# Patient Record
Sex: Female | Born: 1960 | Race: White | Hispanic: No | Marital: Married | Smoking: Former smoker
Health system: Southern US, Community
[De-identification: ages and names within clinical notes are randomized; demographics above are authoritative.]

## PROBLEM LIST (undated history)

## (undated) HISTORY — PX: TONSILLECTOMY: SUR1361

## (undated) HISTORY — PX: CHOLECYSTECTOMY: SHX55

## (undated) HISTORY — PX: BUNIONECTOMY: SHX129

## (undated) HISTORY — PX: CARPAL TUNNEL RELEASE: SHX101

## (undated) HISTORY — PX: EYE SURGERY: SHX253

---

## 2000-11-17 ENCOUNTER — Inpatient Hospital Stay (HOSPITAL_COMMUNITY): Admission: EM | Admit: 2000-11-17 | Discharge: 2000-11-28 | Payer: Self-pay | Admitting: *Deleted

## 2001-10-06 ENCOUNTER — Ambulatory Visit (HOSPITAL_COMMUNITY): Admission: RE | Admit: 2001-10-06 | Discharge: 2001-10-06 | Payer: Self-pay | Admitting: Pulmonary Disease

## 2001-10-06 ENCOUNTER — Encounter: Payer: Self-pay | Admitting: Pulmonary Disease

## 2001-12-15 ENCOUNTER — Ambulatory Visit: Admission: RE | Admit: 2001-12-15 | Discharge: 2001-12-15 | Payer: Self-pay | Admitting: Internal Medicine

## 2003-03-21 ENCOUNTER — Emergency Department (HOSPITAL_COMMUNITY): Admission: EM | Admit: 2003-03-21 | Discharge: 2003-03-21 | Payer: Self-pay | Admitting: Emergency Medicine

## 2003-04-29 ENCOUNTER — Emergency Department (HOSPITAL_COMMUNITY): Admission: EM | Admit: 2003-04-29 | Discharge: 2003-04-30 | Payer: Self-pay | Admitting: Emergency Medicine

## 2004-08-07 ENCOUNTER — Ambulatory Visit: Payer: Self-pay | Admitting: Internal Medicine

## 2004-08-07 ENCOUNTER — Inpatient Hospital Stay (HOSPITAL_COMMUNITY): Admission: EM | Admit: 2004-08-07 | Discharge: 2004-08-15 | Payer: Self-pay | Admitting: Emergency Medicine

## 2004-08-18 ENCOUNTER — Ambulatory Visit: Payer: Self-pay | Admitting: Internal Medicine

## 2005-05-03 ENCOUNTER — Inpatient Hospital Stay (HOSPITAL_COMMUNITY): Admission: EM | Admit: 2005-05-03 | Discharge: 2005-05-10 | Payer: Self-pay | Admitting: Emergency Medicine

## 2006-02-02 ENCOUNTER — Emergency Department (HOSPITAL_COMMUNITY): Admission: EM | Admit: 2006-02-02 | Discharge: 2006-02-02 | Payer: Self-pay | Admitting: Emergency Medicine

## 2006-02-03 ENCOUNTER — Inpatient Hospital Stay (HOSPITAL_COMMUNITY): Admission: EM | Admit: 2006-02-03 | Discharge: 2006-02-10 | Payer: Self-pay | Admitting: Emergency Medicine

## 2006-12-29 IMAGING — CR DG CHEST 2V
2 series · 2 of 2 positions shown · non-contrast
Comparison: Previous studies of 05/03/05.

CLINICAL DATA: Status asthmaticus. 
 CHEST ? 2 VIEW:

[view not recorded (1 of 2)]
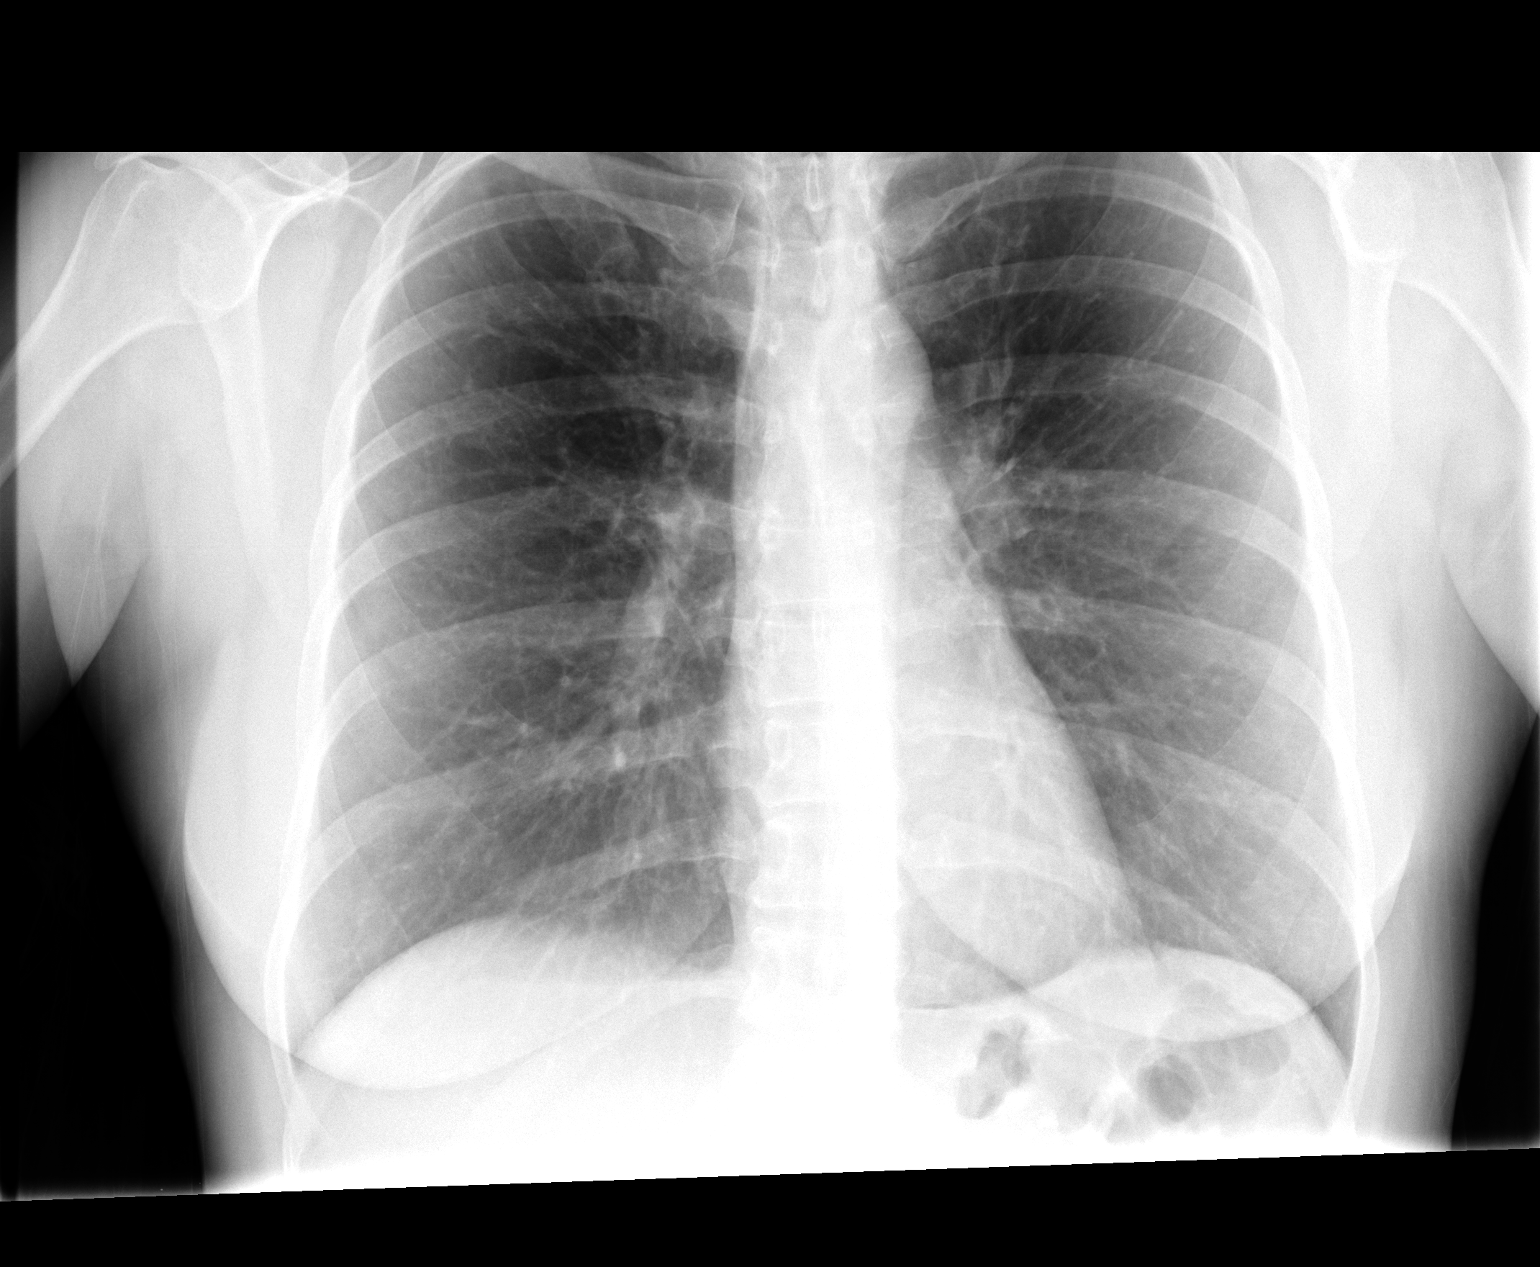

[view not recorded (2 of 2)]
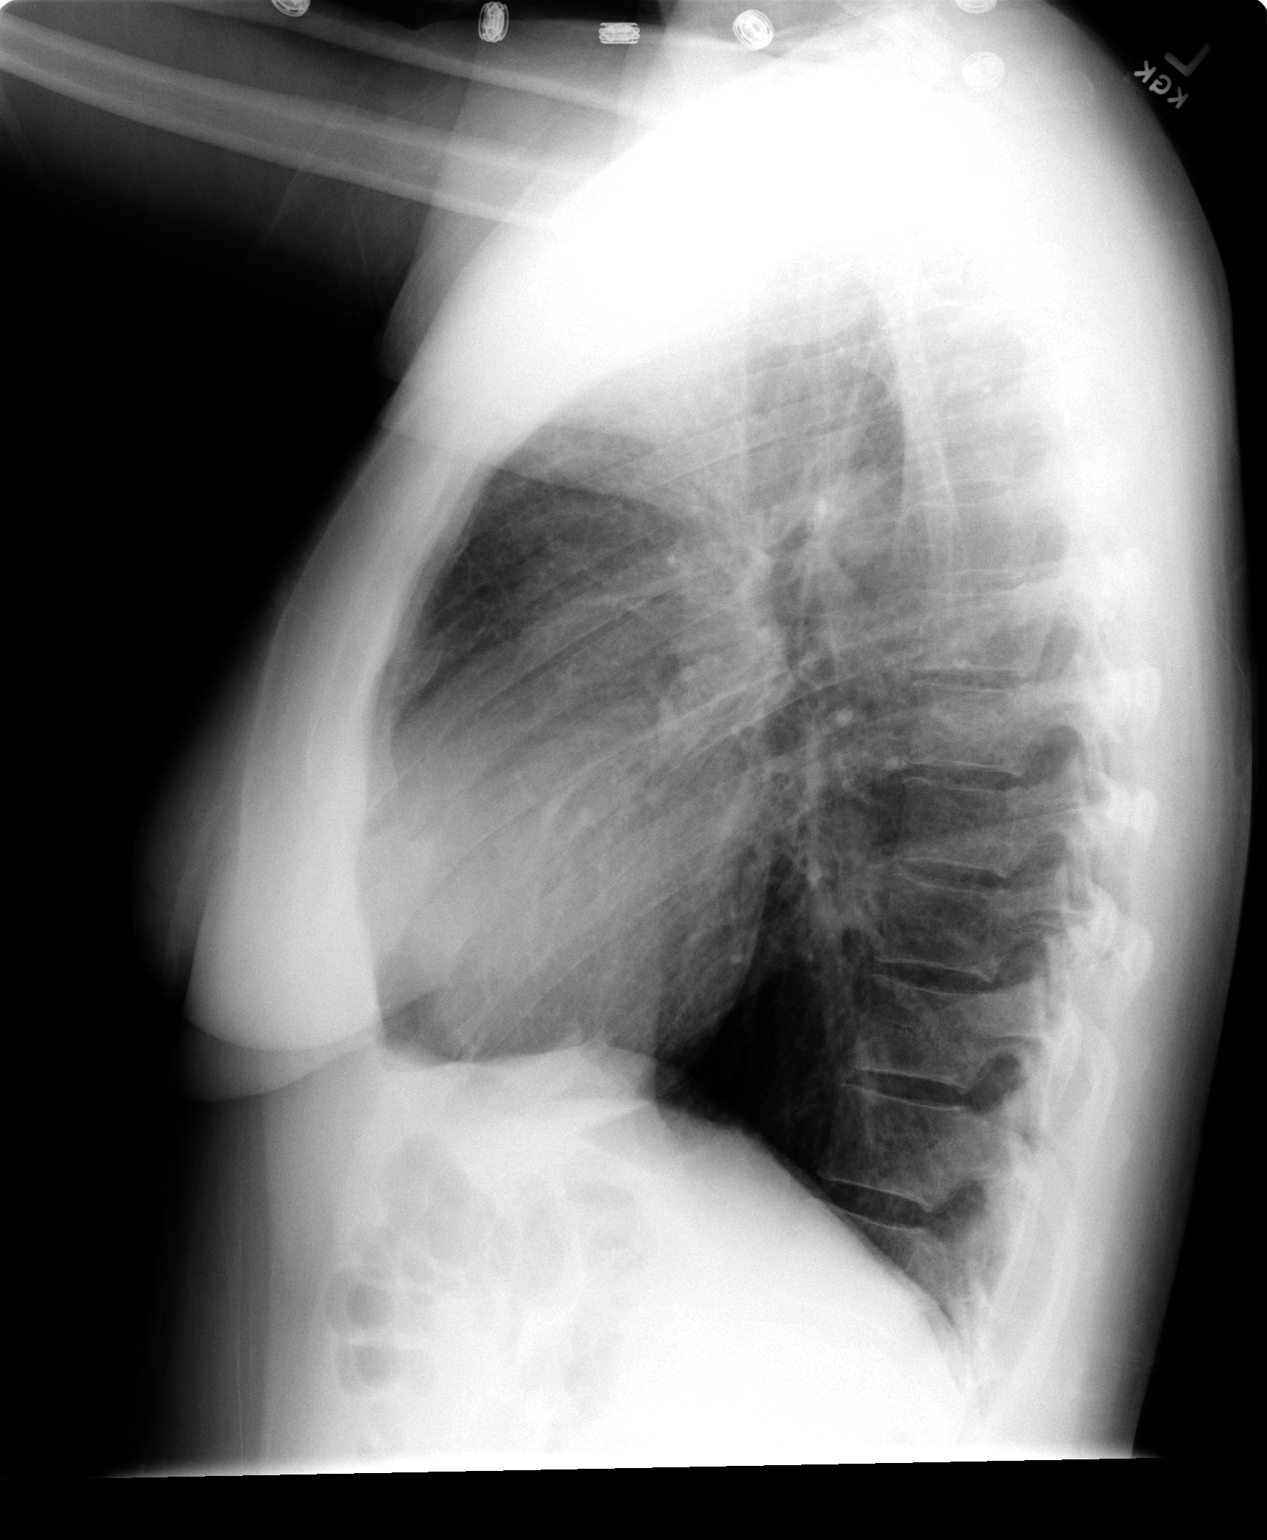

[2 of 2 positions shown; findings below may reference images not displayed]

FINDINGS: PA and lateral views of the chest are made and show somewhat less air trapping.  There is diffuse peribronchial thickening, consistent with an asthmatic bronchitis.  The heart, mediastinum, bones, and soft tissues are normal.
IMPRESSION: Diffuse peribronchial thickening.  Slightly less air trapping than on the previous study.  No consolidation, effusion, or pneumothorax is seen.

## 2013-03-23 ENCOUNTER — Encounter (HOSPITAL_BASED_OUTPATIENT_CLINIC_OR_DEPARTMENT_OTHER): Payer: Self-pay | Attending: Internal Medicine

## 2013-03-23 DIAGNOSIS — X58XXXA Exposure to other specified factors, initial encounter: Secondary | ICD-10-CM | POA: Insufficient documentation

## 2013-03-23 DIAGNOSIS — S99929A Unspecified injury of unspecified foot, initial encounter: Principal | ICD-10-CM

## 2013-03-23 DIAGNOSIS — Z79899 Other long term (current) drug therapy: Secondary | ICD-10-CM | POA: Insufficient documentation

## 2013-03-23 DIAGNOSIS — IMO0002 Reserved for concepts with insufficient information to code with codable children: Secondary | ICD-10-CM | POA: Insufficient documentation

## 2013-03-23 DIAGNOSIS — S99919A Unspecified injury of unspecified ankle, initial encounter: Principal | ICD-10-CM

## 2013-03-23 DIAGNOSIS — S8990XA Unspecified injury of unspecified lower leg, initial encounter: Secondary | ICD-10-CM | POA: Insufficient documentation

## 2013-03-23 DIAGNOSIS — J45909 Unspecified asthma, uncomplicated: Secondary | ICD-10-CM | POA: Insufficient documentation

## 2013-03-24 NOTE — Progress Notes (Signed)
[facility]  NAME:  Jasmine Powell, Jasmine Powell               ACCOUNT NO.:  [id number]  MEDICAL RECORD NO.:  [id number]      DATE OF BIRTH:  1960-08-10  PHYSICIAN:  Maxwell CaulMichael G. Mitsy Owen, M.D. VISIT DATE:  03/23/2013                                  OFFICE VISIT   CHIEF COMPLAINT:  Review of wound on her left anterior leg.  HISTORY OF PRESENT ILLNESS:  This is a patient who has had a wound on her left anterior leg dating back a month ago.  She fell down 3 steps, traumatized her left leg.  She has a series of pictures on her smart phone.  It would appear that this started as a probable hematoma.  There were several wounds all of which have healed except for one and she is here for our review of this.  She is not a diabetic and has no prior wound history.  She is on longstanding prednisone 10 mg every other day related to severe and at one time refractory asthma.  MEDICATIONS:  Adderall 15 mg p.o. b.i.d., prednisone 10 mg every other day.  PHYSICAL EXAMINATION:  VITAL SIGNS:  Temperature 98.5, pulse 81, respirations 17, blood pressure 124/79. RESPIRATORY:  Clear air entry bilaterally with no evidence or wheezing. CARDIAC:  Heart sounds are normal.  No murmurs or gallops. EXTREMITIES:  Peripheral pulses were palpable with an ABI calculated in this clinic at 1.22.  The wound in question was on the left anterior lower extremity.  It measured 1 x 1.3 x 1.0, although there was a considerable amount of undermining at roughly 8-10 o'clock at roughly 3.4 cm.  The area was anesthetized with lidocaine and underwent a light surface debridement. There is considerable swelling around the wound and deep culture was done.  She was started empirically on doxycycline.  There is as mentioned swelling, warmth.  She is generally insensate around the wound suggesting some degree of superficial nerve damage.  IMPRESSION:  Traumatic wound to the left leg as described.  This underwent light  debridement though she did not tolerate this well with anxiety and hyperventilation, which gradually resolved.  We used silver alginate in this wound bed.  She was wrapped with a Coban2.  As noted, a deep culture was done.  She was started on doxycycline.  We will see this again in a week's time.          ______________________________ Maxwell CaulMichael G. Ryver Zadrozny, M.D.     MGR/MEDQ  D:  03/23/2013  T:  03/24/2013  Job:  [id number]

## 2013-04-13 ENCOUNTER — Encounter (HOSPITAL_BASED_OUTPATIENT_CLINIC_OR_DEPARTMENT_OTHER): Payer: Self-pay | Attending: Internal Medicine

## 2013-04-13 DIAGNOSIS — S8990XA Unspecified injury of unspecified lower leg, initial encounter: Secondary | ICD-10-CM | POA: Insufficient documentation

## 2013-04-13 DIAGNOSIS — L97809 Non-pressure chronic ulcer of other part of unspecified lower leg with unspecified severity: Secondary | ICD-10-CM | POA: Insufficient documentation

## 2013-04-13 DIAGNOSIS — S99919A Unspecified injury of unspecified ankle, initial encounter: Secondary | ICD-10-CM

## 2013-04-13 DIAGNOSIS — S99929A Unspecified injury of unspecified foot, initial encounter: Secondary | ICD-10-CM

## 2013-04-13 DIAGNOSIS — X58XXXA Exposure to other specified factors, initial encounter: Secondary | ICD-10-CM | POA: Insufficient documentation

## 2013-05-10 ENCOUNTER — Encounter (HOSPITAL_BASED_OUTPATIENT_CLINIC_OR_DEPARTMENT_OTHER): Payer: Self-pay | Attending: General Surgery

## 2013-05-10 DIAGNOSIS — S99929A Unspecified injury of unspecified foot, initial encounter: Principal | ICD-10-CM

## 2013-05-10 DIAGNOSIS — S8990XA Unspecified injury of unspecified lower leg, initial encounter: Secondary | ICD-10-CM | POA: Insufficient documentation

## 2013-05-10 DIAGNOSIS — S99919A Unspecified injury of unspecified ankle, initial encounter: Principal | ICD-10-CM

## 2013-05-10 DIAGNOSIS — W1789XA Other fall from one level to another, initial encounter: Secondary | ICD-10-CM | POA: Insufficient documentation

## 2018-03-03 ENCOUNTER — Other Ambulatory Visit: Payer: Self-pay

## 2018-03-03 ENCOUNTER — Emergency Department (HOSPITAL_BASED_OUTPATIENT_CLINIC_OR_DEPARTMENT_OTHER)
Admission: EM | Admit: 2018-03-03 | Discharge: 2018-03-03 | Disposition: A | Discharge status: Home / Self Care | Payer: BLUE CROSS/BLUE SHIELD | Attending: Emergency Medicine | Admitting: Emergency Medicine

## 2018-03-03 ENCOUNTER — Emergency Department (HOSPITAL_BASED_OUTPATIENT_CLINIC_OR_DEPARTMENT_OTHER): Payer: BLUE CROSS/BLUE SHIELD

## 2018-03-03 ENCOUNTER — Encounter (HOSPITAL_BASED_OUTPATIENT_CLINIC_OR_DEPARTMENT_OTHER): Payer: Self-pay | Admitting: Emergency Medicine

## 2018-03-03 DIAGNOSIS — Y929 Unspecified place or not applicable: Secondary | ICD-10-CM | POA: Insufficient documentation

## 2018-03-03 DIAGNOSIS — Z87891 Personal history of nicotine dependence: Secondary | ICD-10-CM | POA: Diagnosis not present

## 2018-03-03 DIAGNOSIS — S60222A Contusion of left hand, initial encounter: Secondary | ICD-10-CM | POA: Diagnosis not present

## 2018-03-03 DIAGNOSIS — Y999 Unspecified external cause status: Secondary | ICD-10-CM | POA: Insufficient documentation

## 2018-03-03 DIAGNOSIS — S6992XA Unspecified injury of left wrist, hand and finger(s), initial encounter: Secondary | ICD-10-CM | POA: Diagnosis present

## 2018-03-03 DIAGNOSIS — X58XXXA Exposure to other specified factors, initial encounter: Secondary | ICD-10-CM | POA: Insufficient documentation

## 2018-03-03 DIAGNOSIS — Y939 Activity, unspecified: Secondary | ICD-10-CM | POA: Diagnosis not present

## 2018-03-03 MED ORDER — HYDROCODONE-ACETAMINOPHEN 5-325 MG PO TABS
1.0000 | ORAL_TABLET | Freq: Once | ORAL | Status: DC
  Filled 2018-03-03: qty 1

## 2018-03-03 MED ORDER — IBUPROFEN 800 MG PO TABS
800.0000 mg | ORAL_TABLET | Freq: Once | ORAL | Status: AC
  Administered 2018-03-03: 800 mg via ORAL
  Filled 2018-03-03: qty 1

## 2018-03-03 MED ORDER — IBUPROFEN 800 MG PO TABS
ORAL_TABLET | ORAL | Status: AC
  Filled 2018-03-03: qty 1

## 2018-03-03 NOTE — Discharge Instructions (Addendum)
Wear finger splint and follow up with your hand doctor

## 2018-03-03 NOTE — ED Provider Notes (Signed)
[facility] EMERGENCY DEPARTMENT Provider Note   CSN: [id number] Arrival date & time: 03/03/18  1138     History   Chief Complaint Chief Complaint  Patient presents with  . Hand Injury    HPI Jasmine Powell is a 57 y.o. female.  The history is provided by the patient.  Hand Pain  This is a new problem. The current episode started yesterday. The problem occurs constantly. The problem has not changed since onset.Pertinent negatives include no chest pain, no abdominal pain, no headaches and no shortness of breath. Nothing aggravates the symptoms. Nothing relieves the symptoms. She has tried nothing for the symptoms. The treatment provided no relief.    History reviewed. No pertinent past medical history.  There are no active problems to display for this patient.   Past Surgical History:  Procedure Laterality Date  . BUNIONECTOMY    . CARPAL TUNNEL RELEASE    . CHOLECYSTECTOMY    . EYE SURGERY    . TONSILLECTOMY       OB History   No obstetric history on file.      Home Medications    Prior to Admission medications   Not on File    Family History No family history on file.  Social History Social History   Tobacco Use  . Smoking status: Former Games developermoker  . Smokeless tobacco: Never Used  Substance Use Topics  . Alcohol use: Not on file  . Drug use: Not on file     Allergies   Sulfa antibiotics   Review of Systems Review of Systems  Respiratory: Negative for shortness of breath.   Cardiovascular: Negative for chest pain.  Gastrointestinal: Negative for abdominal pain.  Musculoskeletal: Positive for arthralgias and joint swelling. Negative for back pain, myalgias, neck pain and neck stiffness.  Skin: Negative for color change, rash and wound.  Neurological: Negative for tremors, weakness and headaches.     Physical Exam Updated Vital Signs  ED Triage Vitals  Enc Vitals Group     BP 03/03/18 1149 (!) 148/73     Pulse Rate 03/03/18  1149 83     Resp 03/03/18 1149 16     Temp 03/03/18 1149 98.2 F (36.8 C)     Temp Source 03/03/18 1149 Oral     SpO2 03/03/18 1149 99 %     Weight 03/03/18 1147 163 lb (73.9 kg)     Height 03/03/18 1147 5\' 4"  (1.626 m)     Head Circumference --      Peak Flow --      Pain Score 03/03/18 1147 2     Pain Loc --      Pain Edu? --      Excl. in GC? --     Physical Exam Musculoskeletal:        General: Swelling, tenderness and signs of injury present. No deformity.     Comments: Patient with decreased range of motion of the left middle finger although appears to have good flexion and extension when effort is given, tenderness is over the second and third knuckles on the left hand  Skin:    General: Skin is warm.     Capillary Refill: Capillary refill takes less than 2 seconds.  Neurological:     General: No focal deficit present.     Comments: Normal sensation to the left hand, normal motor function of left hand      ED Treatments / Results  Labs (all labs ordered  are listed, but only abnormal results are displayed) Labs Reviewed - No data to display  EKG None  Radiology Dg Hand Complete Left  Result Date: 03/03/2018 CLINICAL DATA:  Larey SeatFell 2 days ago, now with pain in the left hand and third digit EXAM: LEFT HAND - COMPLETE 3+ VIEW COMPARISON:  None. FINDINGS: The left radiocarpal joint space appears normal and the carpal bones are in normal position. MCP and PIP joints appear normal. On the oblique view there is slight irregularity of the distal aspect of the middle phalanx of the left second digit which may be old, but acute fracture can not be excluded. No other abnormality is seen. IMPRESSION: 1. Can not exclude fracture involving the distal aspect of the middle phalanx of the left second digit. 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 Correlate clinically. 2. No other acute abnormality is seen. Electronically Signed    By: Dwyane DeePaul  Barry M.D.   On: 03/03/2018 12:18    Procedures Procedures (including critical care time)  Medications Ordered in ED Medications  ibuprofen (ADVIL,MOTRIN) 800 MG tablet (has no administration in time range)  ibuprofen (ADVIL,MOTRIN) tablet 800 mg (800 mg Oral Given 03/03/18 1212)     Initial Impression / Assessment and Plan / ED Course  I have reviewed the triage vital signs and the nursing notes.  Pertinent labs & imaging results that were available during my care of the patient were reviewed by me and considered in my medical decision making (see chart for details).     Jasmine Powell is a 57 year old female who presents to the ED with left hand pain.  Patient hit her hand at home yesterday.  Has mostly tenderness around her left second and third knuckle.  She has most pain when trying to move her left middle finger.  There is no obvious flexor or extensor tendon injury to her hand.  She appears to have normal motor function, normal sensation.  X-ray did not show any acute fracture.  There was a questionable fracture to the distal part of the left second digit but patient is not tender there.  Patient was placed in a splint in the left middle finger and states that she has a hand surgeon she can follow-up with.  Suspect patient likely just has contusion of her hand.  Lower concern for tendon injury.  However will immobilize, treatment Motrin, ice.  Discharged in good condition.  This chart was dictated using voice recognition software.  Despite best efforts to proofread,  errors can occur which can change the documentation meaning.   Final Clinical Impressions(s) / ED Diagnoses   Final diagnoses:  Contusion of left hand, initial encounter    ED Discharge Orders    None       Virgina NorfolkCuratolo, Laiya Wisby, DO 03/03/18 1242

## 2018-03-03 NOTE — ED Triage Notes (Signed)
Pt tripped and fell 2 days ago. C/o L hand pain. Also has skin tear noted to L arm. Denies LOC

## 2021-10-08 DIAGNOSIS — M9901 Segmental and somatic dysfunction of cervical region: Secondary | ICD-10-CM | POA: Diagnosis not present

## 2021-10-08 DIAGNOSIS — M9902 Segmental and somatic dysfunction of thoracic region: Secondary | ICD-10-CM | POA: Diagnosis not present

## 2021-10-08 DIAGNOSIS — M9903 Segmental and somatic dysfunction of lumbar region: Secondary | ICD-10-CM | POA: Diagnosis not present

## 2021-10-08 DIAGNOSIS — M531 Cervicobrachial syndrome: Secondary | ICD-10-CM | POA: Diagnosis not present

## 2021-10-08 DIAGNOSIS — M9904 Segmental and somatic dysfunction of sacral region: Secondary | ICD-10-CM | POA: Diagnosis not present

## 2021-10-22 DIAGNOSIS — M9901 Segmental and somatic dysfunction of cervical region: Secondary | ICD-10-CM | POA: Diagnosis not present

## 2021-10-22 DIAGNOSIS — M531 Cervicobrachial syndrome: Secondary | ICD-10-CM | POA: Diagnosis not present

## 2021-10-22 DIAGNOSIS — M9902 Segmental and somatic dysfunction of thoracic region: Secondary | ICD-10-CM | POA: Diagnosis not present

## 2021-10-22 DIAGNOSIS — M9904 Segmental and somatic dysfunction of sacral region: Secondary | ICD-10-CM | POA: Diagnosis not present

## 2021-10-22 DIAGNOSIS — M9903 Segmental and somatic dysfunction of lumbar region: Secondary | ICD-10-CM | POA: Diagnosis not present

## 2021-11-05 DIAGNOSIS — M9904 Segmental and somatic dysfunction of sacral region: Secondary | ICD-10-CM | POA: Diagnosis not present

## 2021-11-05 DIAGNOSIS — M9901 Segmental and somatic dysfunction of cervical region: Secondary | ICD-10-CM | POA: Diagnosis not present

## 2021-11-05 DIAGNOSIS — M9902 Segmental and somatic dysfunction of thoracic region: Secondary | ICD-10-CM | POA: Diagnosis not present

## 2021-11-05 DIAGNOSIS — M531 Cervicobrachial syndrome: Secondary | ICD-10-CM | POA: Diagnosis not present

## 2021-11-05 DIAGNOSIS — M9903 Segmental and somatic dysfunction of lumbar region: Secondary | ICD-10-CM | POA: Diagnosis not present

## 2021-11-17 DIAGNOSIS — D126 Benign neoplasm of colon, unspecified: Secondary | ICD-10-CM | POA: Diagnosis not present

## 2021-11-17 DIAGNOSIS — K862 Cyst of pancreas: Secondary | ICD-10-CM | POA: Diagnosis not present

## 2021-11-18 DIAGNOSIS — M531 Cervicobrachial syndrome: Secondary | ICD-10-CM | POA: Diagnosis not present

## 2021-11-18 DIAGNOSIS — M9902 Segmental and somatic dysfunction of thoracic region: Secondary | ICD-10-CM | POA: Diagnosis not present

## 2021-11-18 DIAGNOSIS — M9901 Segmental and somatic dysfunction of cervical region: Secondary | ICD-10-CM | POA: Diagnosis not present

## 2021-11-18 DIAGNOSIS — M9903 Segmental and somatic dysfunction of lumbar region: Secondary | ICD-10-CM | POA: Diagnosis not present

## 2021-11-18 DIAGNOSIS — M9904 Segmental and somatic dysfunction of sacral region: Secondary | ICD-10-CM | POA: Diagnosis not present

## 2021-12-02 DIAGNOSIS — R1013 Epigastric pain: Secondary | ICD-10-CM | POA: Diagnosis not present

## 2021-12-03 DIAGNOSIS — I4510 Unspecified right bundle-branch block: Secondary | ICD-10-CM | POA: Diagnosis not present

## 2021-12-04 DIAGNOSIS — M25551 Pain in right hip: Secondary | ICD-10-CM | POA: Diagnosis not present

## 2021-12-05 DIAGNOSIS — M25551 Pain in right hip: Secondary | ICD-10-CM | POA: Diagnosis not present

## 2021-12-09 DIAGNOSIS — M25551 Pain in right hip: Secondary | ICD-10-CM | POA: Diagnosis not present

## 2021-12-11 DIAGNOSIS — M25551 Pain in right hip: Secondary | ICD-10-CM | POA: Diagnosis not present

## 2021-12-16 DIAGNOSIS — M25551 Pain in right hip: Secondary | ICD-10-CM | POA: Diagnosis not present

## 2021-12-22 DIAGNOSIS — M25551 Pain in right hip: Secondary | ICD-10-CM | POA: Diagnosis not present

## 2021-12-24 DIAGNOSIS — M25551 Pain in right hip: Secondary | ICD-10-CM | POA: Diagnosis not present

## 2021-12-25 DIAGNOSIS — K3189 Other diseases of stomach and duodenum: Secondary | ICD-10-CM | POA: Diagnosis not present

## 2021-12-25 DIAGNOSIS — K862 Cyst of pancreas: Secondary | ICD-10-CM | POA: Diagnosis not present

## 2021-12-25 DIAGNOSIS — D126 Benign neoplasm of colon, unspecified: Secondary | ICD-10-CM | POA: Diagnosis not present

## 2021-12-25 DIAGNOSIS — K5730 Diverticulosis of large intestine without perforation or abscess without bleeding: Secondary | ICD-10-CM | POA: Diagnosis not present

## 2021-12-25 DIAGNOSIS — Z1211 Encounter for screening for malignant neoplasm of colon: Secondary | ICD-10-CM | POA: Diagnosis not present

## 2021-12-25 DIAGNOSIS — Z86010 Personal history of colonic polyps: Secondary | ICD-10-CM | POA: Diagnosis not present

## 2021-12-25 DIAGNOSIS — K2950 Unspecified chronic gastritis without bleeding: Secondary | ICD-10-CM | POA: Diagnosis not present

## 2021-12-26 DIAGNOSIS — M9903 Segmental and somatic dysfunction of lumbar region: Secondary | ICD-10-CM | POA: Diagnosis not present

## 2021-12-26 DIAGNOSIS — M531 Cervicobrachial syndrome: Secondary | ICD-10-CM | POA: Diagnosis not present

## 2021-12-26 DIAGNOSIS — M9902 Segmental and somatic dysfunction of thoracic region: Secondary | ICD-10-CM | POA: Diagnosis not present

## 2021-12-26 DIAGNOSIS — M9904 Segmental and somatic dysfunction of sacral region: Secondary | ICD-10-CM | POA: Diagnosis not present

## 2021-12-26 DIAGNOSIS — M9901 Segmental and somatic dysfunction of cervical region: Secondary | ICD-10-CM | POA: Diagnosis not present

## 2021-12-29 DIAGNOSIS — M25551 Pain in right hip: Secondary | ICD-10-CM | POA: Diagnosis not present

## 2021-12-29 DIAGNOSIS — Z79899 Other long term (current) drug therapy: Secondary | ICD-10-CM | POA: Diagnosis not present

## 2021-12-31 DIAGNOSIS — M25551 Pain in right hip: Secondary | ICD-10-CM | POA: Diagnosis not present

## 2022-01-05 DIAGNOSIS — M25551 Pain in right hip: Secondary | ICD-10-CM | POA: Diagnosis not present

## 2022-01-07 DIAGNOSIS — M25551 Pain in right hip: Secondary | ICD-10-CM | POA: Diagnosis not present

## 2022-01-12 DIAGNOSIS — M25551 Pain in right hip: Secondary | ICD-10-CM | POA: Diagnosis not present

## 2022-01-16 DIAGNOSIS — M9901 Segmental and somatic dysfunction of cervical region: Secondary | ICD-10-CM | POA: Diagnosis not present

## 2022-01-16 DIAGNOSIS — M9902 Segmental and somatic dysfunction of thoracic region: Secondary | ICD-10-CM | POA: Diagnosis not present

## 2022-01-16 DIAGNOSIS — M531 Cervicobrachial syndrome: Secondary | ICD-10-CM | POA: Diagnosis not present

## 2022-01-16 DIAGNOSIS — M9903 Segmental and somatic dysfunction of lumbar region: Secondary | ICD-10-CM | POA: Diagnosis not present

## 2022-01-16 DIAGNOSIS — M9904 Segmental and somatic dysfunction of sacral region: Secondary | ICD-10-CM | POA: Diagnosis not present

## 2022-01-19 DIAGNOSIS — M25551 Pain in right hip: Secondary | ICD-10-CM | POA: Diagnosis not present

## 2022-01-21 DIAGNOSIS — M25551 Pain in right hip: Secondary | ICD-10-CM | POA: Diagnosis not present

## 2022-02-02 DIAGNOSIS — M25551 Pain in right hip: Secondary | ICD-10-CM | POA: Diagnosis not present

## 2022-02-04 DIAGNOSIS — M25551 Pain in right hip: Secondary | ICD-10-CM | POA: Diagnosis not present

## 2022-02-09 DIAGNOSIS — M25551 Pain in right hip: Secondary | ICD-10-CM | POA: Diagnosis not present

## 2022-02-11 DIAGNOSIS — E785 Hyperlipidemia, unspecified: Secondary | ICD-10-CM | POA: Diagnosis not present

## 2022-02-11 DIAGNOSIS — Z0000 Encounter for general adult medical examination without abnormal findings: Secondary | ICD-10-CM | POA: Diagnosis not present

## 2022-02-11 DIAGNOSIS — R059 Cough, unspecified: Secondary | ICD-10-CM | POA: Diagnosis not present

## 2022-02-11 DIAGNOSIS — Z131 Encounter for screening for diabetes mellitus: Secondary | ICD-10-CM | POA: Diagnosis not present

## 2022-02-27 DIAGNOSIS — M25551 Pain in right hip: Secondary | ICD-10-CM | POA: Diagnosis not present

## 2022-03-04 DIAGNOSIS — M9901 Segmental and somatic dysfunction of cervical region: Secondary | ICD-10-CM | POA: Diagnosis not present

## 2022-03-04 DIAGNOSIS — M9904 Segmental and somatic dysfunction of sacral region: Secondary | ICD-10-CM | POA: Diagnosis not present

## 2022-03-04 DIAGNOSIS — M9902 Segmental and somatic dysfunction of thoracic region: Secondary | ICD-10-CM | POA: Diagnosis not present

## 2022-03-04 DIAGNOSIS — M9903 Segmental and somatic dysfunction of lumbar region: Secondary | ICD-10-CM | POA: Diagnosis not present

## 2022-03-04 DIAGNOSIS — M531 Cervicobrachial syndrome: Secondary | ICD-10-CM | POA: Diagnosis not present

## 2022-03-04 DIAGNOSIS — M25551 Pain in right hip: Secondary | ICD-10-CM | POA: Diagnosis not present

## 2022-03-06 DIAGNOSIS — M25551 Pain in right hip: Secondary | ICD-10-CM | POA: Diagnosis not present

## 2022-03-30 DIAGNOSIS — F900 Attention-deficit hyperactivity disorder, predominantly inattentive type: Secondary | ICD-10-CM | POA: Diagnosis not present

## 2022-04-30 DIAGNOSIS — E785 Hyperlipidemia, unspecified: Secondary | ICD-10-CM | POA: Diagnosis not present

## 2022-04-30 DIAGNOSIS — Z760 Encounter for issue of repeat prescription: Secondary | ICD-10-CM | POA: Diagnosis not present

## 2022-04-30 DIAGNOSIS — F900 Attention-deficit hyperactivity disorder, predominantly inattentive type: Secondary | ICD-10-CM | POA: Diagnosis not present

## 2023-11-08 ENCOUNTER — Ambulatory Visit

## 2023-11-08 ENCOUNTER — Ambulatory Visit: Admission: RE | Admit: 2023-11-08 | Discharge: 2023-11-08 | Disposition: A | Discharge status: Home / Self Care

## 2023-11-08 VITALS — BP 132/69 | HR 62 | Temp 98.1°F | Resp 18 | Ht 64.0 in | Wt 150.0 lb

## 2023-11-08 DIAGNOSIS — S6991XA Unspecified injury of right wrist, hand and finger(s), initial encounter: Secondary | ICD-10-CM

## 2023-11-08 DIAGNOSIS — M25441 Effusion, right hand: Secondary | ICD-10-CM

## 2023-11-08 DIAGNOSIS — M7989 Other specified soft tissue disorders: Secondary | ICD-10-CM

## 2023-11-08 DIAGNOSIS — M79641 Pain in right hand: Secondary | ICD-10-CM | POA: Diagnosis not present

## 2023-11-08 NOTE — ED Provider Notes (Signed)
 [facility]    CSN: [id number] Arrival date & time: 11/08/23  1600      History   Chief Complaint Chief Complaint  Patient presents with   Hand Problem    Entered by patient    HPI Jasmine Powell is a 63 y.o. female.   HPI 63 year old female presents with hand.  History reviewed. No pertinent past medical history.  There are no active problems to display for this patient.   Past Surgical History:  Procedure Laterality Date   BUNIONECTOMY     CARPAL TUNNEL RELEASE     CHOLECYSTECTOMY     EYE SURGERY     TONSILLECTOMY      OB History   No obstetric history on file.      Home Medications    Prior to Admission medications   Medication Sig Start Date End Date Taking? Authorizing Provider  amphetamine-dextroamphetamine (ADDERALL) 30 MG tablet Take 0.5 tablets by mouth 2 (two) times daily. 07/06/16  Yes [provider]  Cholecalciferol 125 MCG (5000 UT) TABS Take 5,000 Units by mouth. 11/17/21  Yes [provider]  doxepin (SINEQUAN) 10 MG capsule Take 10 mg by mouth. 08/30/23 08/29/24 Yes [provider]  gabapentin (NEURONTIN) 300 MG capsule Take 300 mg by mouth. 02/25/17  Yes [provider]    Family History History reviewed. No pertinent family history.  Social History Social History   Tobacco Use   Smoking status: Former   Smokeless tobacco: Never  Advertising account planner   Vaping status: Never Used  Substance Use Topics   Alcohol use: Not Currently   Drug use: Never     Allergies   Sulfa antibiotics   Review of Systems Review of Systems   Physical Exam Triage Vital Signs ED Triage Vitals  Encounter Vitals Group     BP      Girls Systolic BP Percentile      Girls Diastolic BP Percentile      Boys Systolic BP Percentile      Boys Diastolic BP Percentile      Pulse      Resp      Temp      Temp src      SpO2      Weight      Height      Head Circumference      Peak Flow      Pain Score       Pain Loc      Pain Education      Exclude from Growth Chart    No data found.  Updated Vital Signs BP 132/69 (BP Location: Right Arm)   Pulse 62   Temp 98.1 F (36.7 C) (Oral)   Resp 18   Ht 5' 4 (1.626 m)   Wt 150 lb (68 kg)   SpO2 99%   BMI 25.75 kg/m   Visual Acuity Right Eye Distance:   Left Eye Distance:   Bilateral Distance:    Right Eye Near:   Left Eye Near:    Bilateral Near:     Physical Exam Vitals and nursing note reviewed.  Constitutional:      Appearance: Normal appearance. She is normal weight.  HENT:     Head: Normocephalic and atraumatic.     Mouth/Throat:     Mouth: Mucous membranes are moist.     Pharynx: Oropharynx is clear.  Eyes:     Extraocular Movements: Extraocular movements intact.  Pupils: Pupils are equal, round, and reactive to light.  Cardiovascular:     Rate and Rhythm: Normal rate and regular rhythm.     Pulses: Normal pulses.     Heart sounds: Normal heart sounds.  Pulmonary:     Effort: Pulmonary effort is normal.     Breath sounds: Normal breath sounds. No wheezing, rhonchi or rales.  Musculoskeletal:        General: Normal range of motion.     Comments: Right hand (dorsum and palmar aspects): Soft tissue swelling and ecchymosis noted, grip is 3/5, neurovascular/neurosensory intact, brisk cap refill  Skin:    General: Skin is warm and dry.  Neurological:     General: No focal deficit present.     Mental Status: She is alert and oriented to person, place, and time.  Psychiatric:        Mood and Affect: Mood normal.        Behavior: Behavior normal.         UC Treatments / Results  Labs (all labs ordered are listed, but only abnormal results are displayed) Labs Reviewed - No data to display  EKG   Radiology DG Hand Complete Right Result Date: 11/08/2023 EXAM: 3 or more VIEW(S) XRAY OF THE RIGHT HAND 11/08/2023 04:41:09 PM COMPARISON: None available. CLINICAL HISTORY: Right hand injury. Patient states that  she tripped over a cooler yesterday trying to catch her dog, injuring her right hand. The hand is swollen and painful. FINDINGS: BONES AND JOINTS: No acute fracture. No focal osseous lesion. No joint dislocation. SOFT TISSUES: Mild dorsal soft tissue swelling. IMPRESSION: 1. No acute osseous abnormality. 2. Mild dorsal soft tissue swelling. Electronically signed by: Lonni Necessary MD 11/08/2023 05:14 PM EDT RP Workstation: [id number]    Procedures Procedures (including critical care time)  Medications Ordered in UC Medications - No data to display  Initial Impression / Assessment and Plan / UC Course  I have reviewed the triage vital signs and the nursing notes.  Pertinent labs & imaging results that were available during my care of the patient were reviewed by me and considered in my medical decision making (see chart for details).     MDM: 1.  Swelling of hand joint, right-right hand x-ray results revealed above; 2.  Injury of right hand, initial encounter-ace wrap placed on right head prior to discharge today. Advised patient of right hand x-ray results with hardcopy and image provided.  Advised patient to RICE affected area of right hand for 30 minutes 3 times daily for the next 3 days.  Advised may take OTC Ibuprofen  800 mg daily or as needed for right hand pain.  Advised if symptoms worsen and/or unresolved please follow-up with your PCP [facility] orthopedics, or here for further evaluation.  Patient discharged home, hemodynamically stable Final Clinical Impressions(s) / UC Diagnoses   Final diagnoses:  Injury of right hand, initial encounter  Swelling of hand joint, right     Discharge Instructions      Advised patient of right hand x-ray results with hardcopy and image provided.  Advised patient to RICE affected area of right hand for 30 minutes 3 times daily for the next 3 days.  Advised may take OTC Ibuprofen  800 mg daily or as needed for right hand pain.  Advised if  symptoms worsen and/or unresolved please follow-up with your PCP [facility], or here for further evaluation.     ED Prescriptions   None    PDMP not  reviewed this encounter.   Teddy Sharper, FNP 11/08/23 [phone]

## 2023-11-08 NOTE — ED Triage Notes (Signed)
 Patient states that she tripped over a cooler yesterday trying to catch her dog, injuring her right hand.  The hand is swollen and painful.  Patient has taken Ibuprofen .

## 2023-11-08 NOTE — Discharge Instructions (Addendum)
 Advised patient of right hand x-ray results with hardcopy and image provided.  Advised patient to RICE affected area of right hand for 30 minutes 3 times daily for the next 3 days.  Advised may take OTC Ibuprofen  800 mg daily or as needed for right hand pain.  Advised if symptoms worsen and/or unresolved please follow-up with your PCP [facility], or here for further evaluation.
